# Patient Record
Sex: Female | Born: 1975 | Race: White | Hispanic: No | Marital: Single | Smoking: Current every day smoker
Health system: Southern US, Community
[De-identification: ages and names within clinical notes are randomized; demographics above are authoritative.]

## PROBLEM LIST (undated history)

## (undated) DIAGNOSIS — I10 Essential (primary) hypertension: Secondary | ICD-10-CM

## (undated) DIAGNOSIS — J45909 Unspecified asthma, uncomplicated: Secondary | ICD-10-CM

## (undated) DIAGNOSIS — F419 Anxiety disorder, unspecified: Secondary | ICD-10-CM

---

## 2004-03-31 ENCOUNTER — Ambulatory Visit: Payer: Self-pay

## 2004-04-22 ENCOUNTER — Other Ambulatory Visit: Payer: Self-pay

## 2004-04-22 ENCOUNTER — Emergency Department: Payer: Self-pay | Admitting: Emergency Medicine

## 2004-08-05 ENCOUNTER — Observation Stay: Payer: Self-pay | Admitting: Obstetrics and Gynecology

## 2004-08-13 ENCOUNTER — Observation Stay: Payer: Self-pay | Admitting: Certified Nurse Midwife

## 2004-09-04 ENCOUNTER — Observation Stay: Payer: Self-pay | Admitting: Obstetrics and Gynecology

## 2004-09-07 ENCOUNTER — Observation Stay: Payer: Self-pay

## 2004-09-10 ENCOUNTER — Observation Stay: Payer: Self-pay | Admitting: Obstetrics and Gynecology

## 2004-09-13 ENCOUNTER — Observation Stay: Payer: Self-pay | Admitting: Certified Nurse Midwife

## 2004-09-18 ENCOUNTER — Observation Stay: Payer: Self-pay

## 2004-09-19 ENCOUNTER — Observation Stay: Payer: Self-pay | Admitting: Obstetrics and Gynecology

## 2004-09-22 ENCOUNTER — Observation Stay: Payer: Self-pay | Admitting: Obstetrics and Gynecology

## 2004-09-26 ENCOUNTER — Observation Stay: Payer: Self-pay

## 2004-09-29 ENCOUNTER — Observation Stay: Payer: Self-pay

## 2004-10-03 ENCOUNTER — Observation Stay: Payer: Self-pay | Admitting: Obstetrics and Gynecology

## 2004-10-04 ENCOUNTER — Inpatient Hospital Stay: Payer: Self-pay | Admitting: Obstetrics and Gynecology

## 2005-03-27 ENCOUNTER — Ambulatory Visit: Payer: Self-pay | Admitting: Psychiatry

## 2005-03-31 ENCOUNTER — Ambulatory Visit: Payer: Self-pay | Admitting: Psychiatry

## 2005-12-24 ENCOUNTER — Ambulatory Visit: Payer: Self-pay

## 2006-06-17 IMAGING — US US OB US >=[ID] SNGL FETUS
1 series · 14 of 18 positions shown · non-contrast
Comparison: none

REASON FOR EXAM: dates
COMMENTS:

[Series 1: us ob us >=(id) sngl fetus · 0.27mm/px · 14 of 18 slices shown]
[im 1/18]
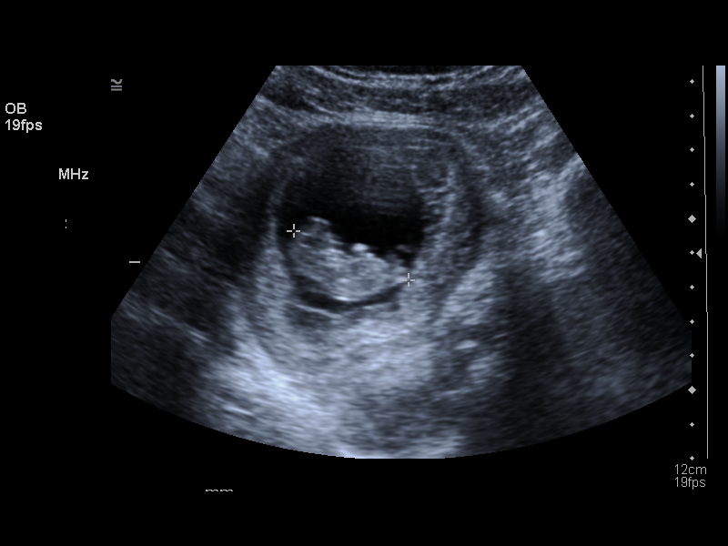
[im 2/18]
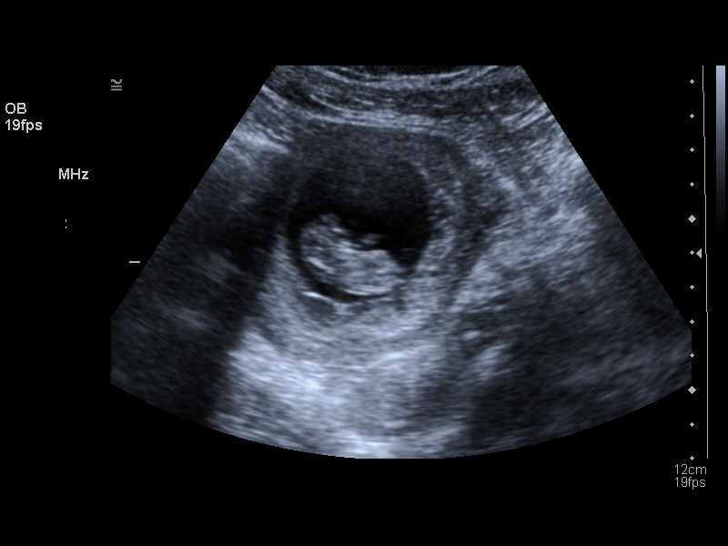
[im 4/18]
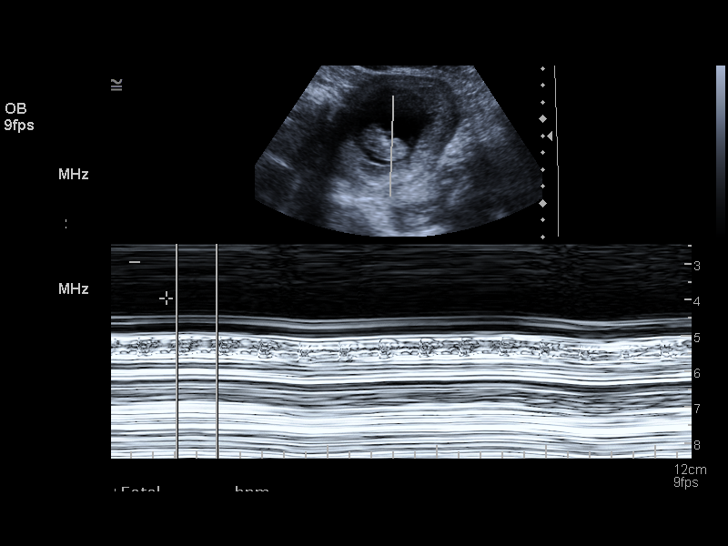
[im 5/18]
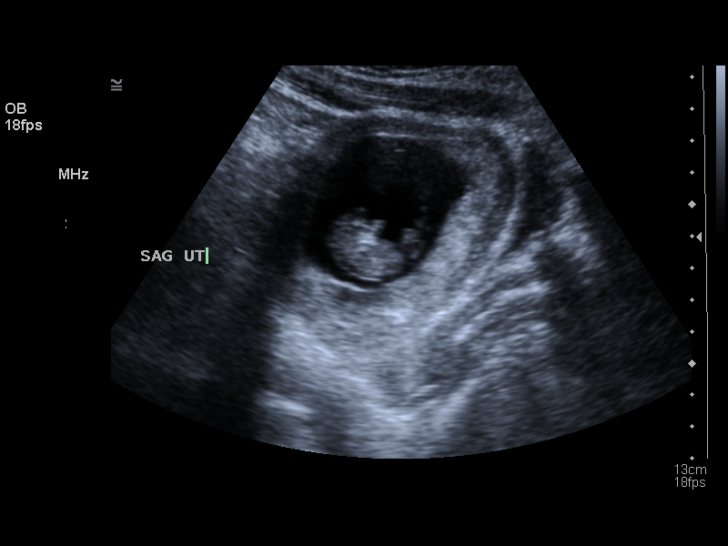
[im 6/18]
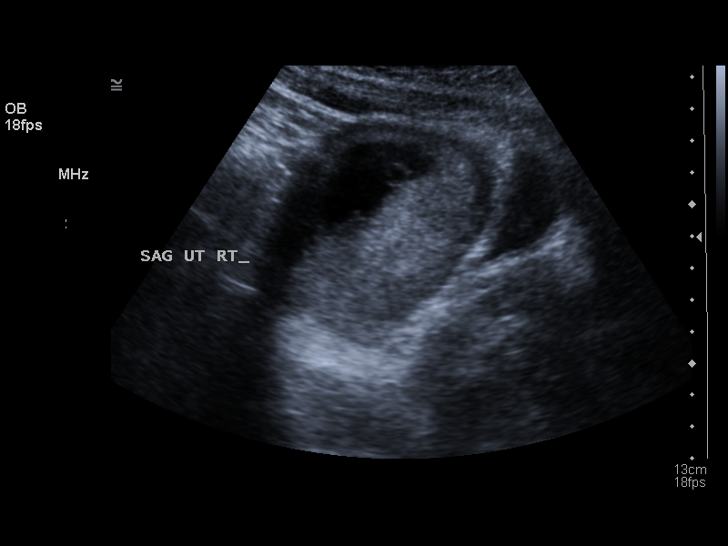
[im 8/18]
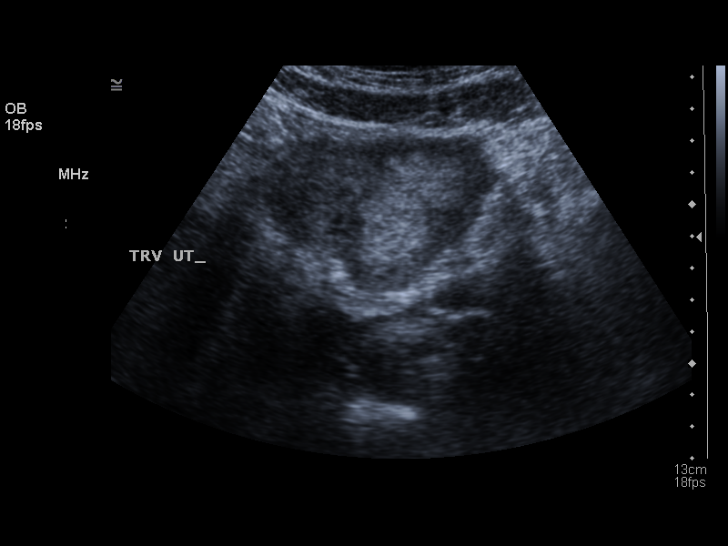
[im 9/18]
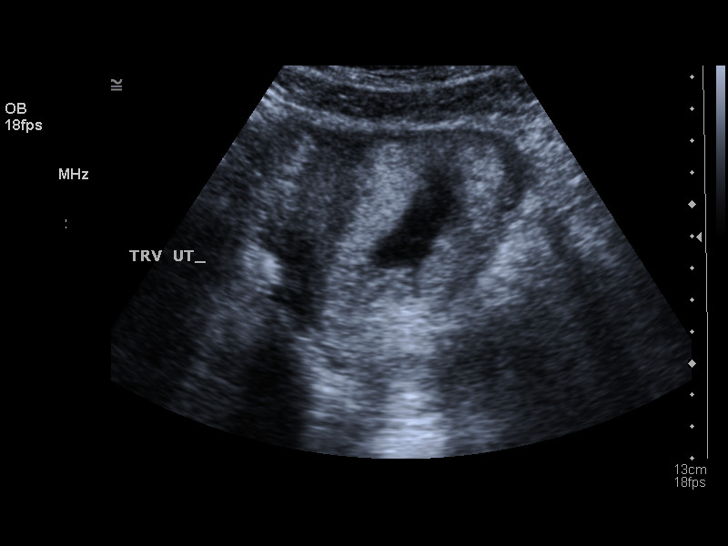
[im 10/18]
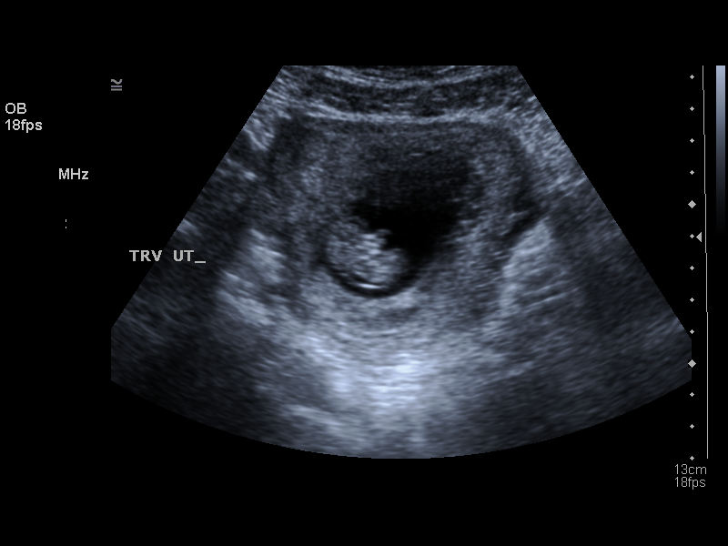
[im 11/18]
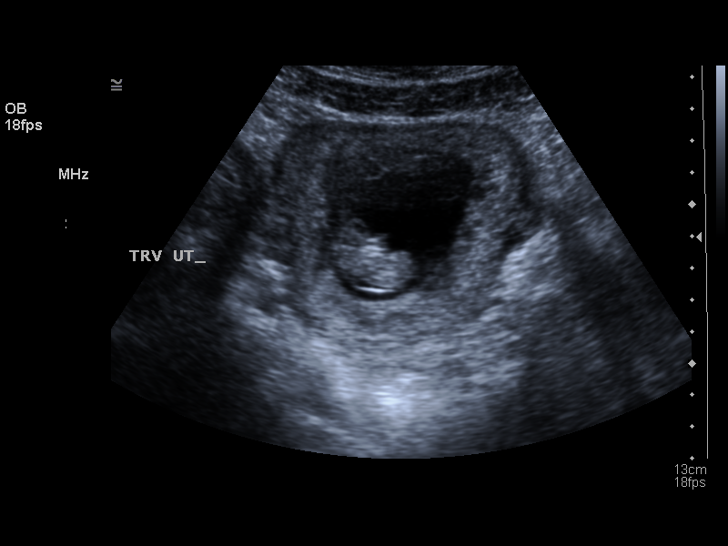
[im 13/18]
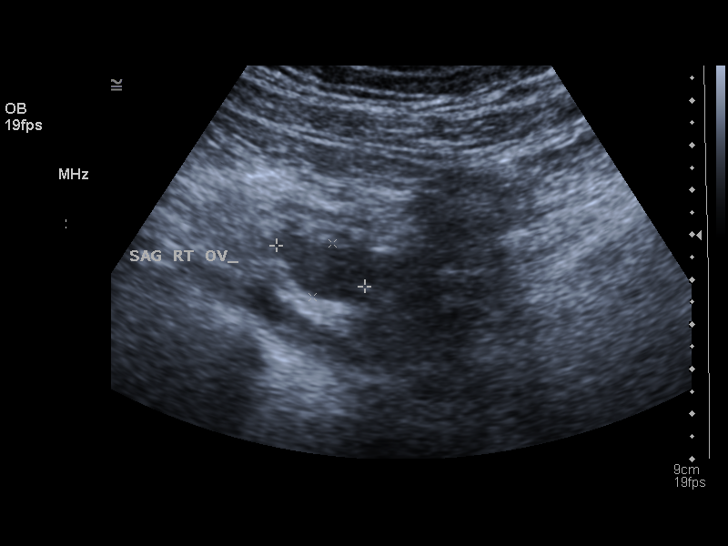
[im 14/18]
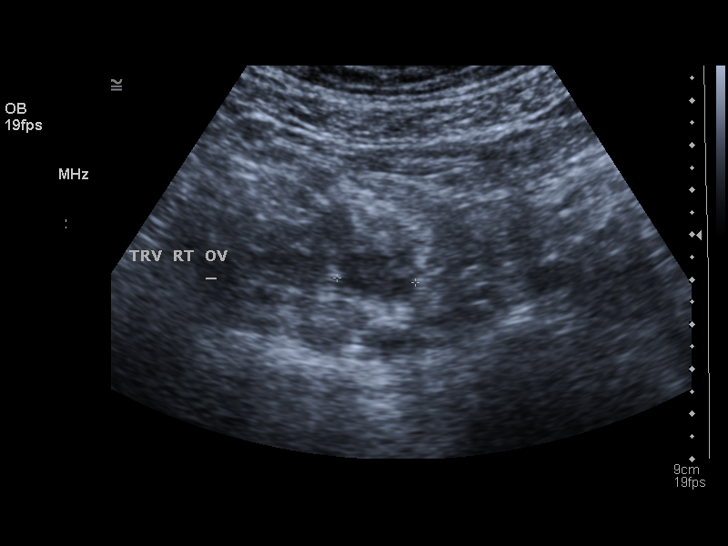
[im 15/18]
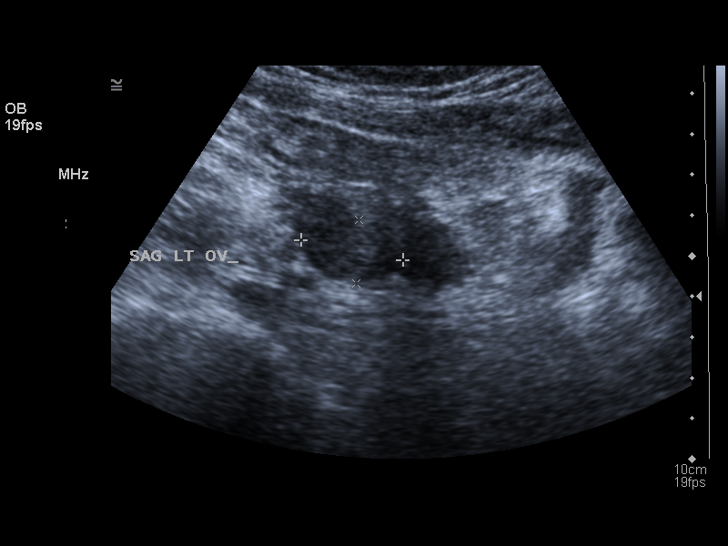
[im 17/18]
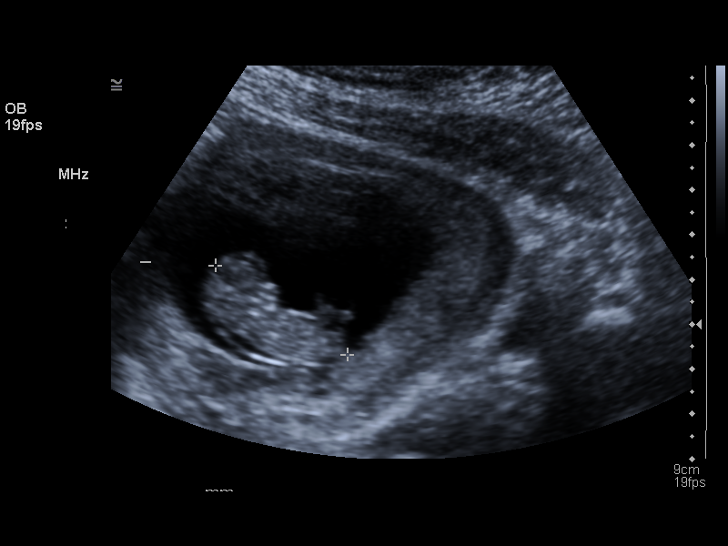
[im 18/18]
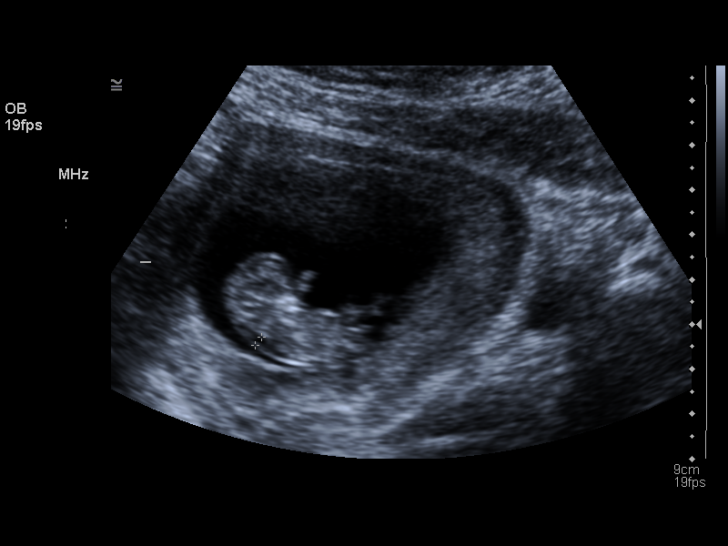

[14 of 18 positions shown; findings below may reference images not displayed]

PROCEDURE:     US  - US PREGNANCY / FETAL AGE  - March 31, 2004 [DATE]

RESULT:     Ultrasound evaluation of the gravid uterus shows a single,
viable intrauterine pregnancy with a heart rate of 163 beats per minute.
Crown-rump length measures 36.4 mm consistent with ultrasound dating of 10
weeks, 4 days and an estimated date of delivery of 10/23/2004.
IMPRESSION: Single, viable intrauterine pregnancy with ultrasound dating
of 10 weeks, 4 days and estimated date of delivery of 10/23/2004.

## 2008-05-01 ENCOUNTER — Inpatient Hospital Stay: Payer: Self-pay | Admitting: *Deleted

## 2009-01-14 ENCOUNTER — Ambulatory Visit: Payer: Self-pay | Admitting: Family Medicine

## 2009-02-02 ENCOUNTER — Ambulatory Visit: Payer: Self-pay | Admitting: Unknown Physician Specialty

## 2009-03-01 ENCOUNTER — Ambulatory Visit: Payer: Self-pay | Admitting: Family Medicine

## 2009-12-23 ENCOUNTER — Ambulatory Visit: Payer: Self-pay | Admitting: Family Medicine

## 2012-02-21 DIAGNOSIS — O9989 Other specified diseases and conditions complicating pregnancy, childbirth and the puerperium: Secondary | ICD-10-CM

## 2012-02-21 DIAGNOSIS — IMO0002 Reserved for concepts with insufficient information to code with codable children: Secondary | ICD-10-CM

## 2012-02-21 HISTORY — DX: Reserved for concepts with insufficient information to code with codable children: IMO0002

## 2012-02-21 HISTORY — DX: Other specified diseases and conditions complicating pregnancy, childbirth and the puerperium: O99.89

## 2013-03-12 ENCOUNTER — Emergency Department: Payer: Self-pay | Admitting: Emergency Medicine

## 2013-03-12 LAB — COMPREHENSIVE METABOLIC PANEL
AST: 16 U/L (ref 15–37)
Albumin: 3.8 g/dL (ref 3.4–5.0)
Alkaline Phosphatase: 69 U/L
Anion Gap: 3 — ABNORMAL LOW (ref 7–16)
BILIRUBIN TOTAL: 0.5 mg/dL (ref 0.2–1.0)
BUN: 8 mg/dL (ref 7–18)
CO2: 29 mmol/L (ref 21–32)
Calcium, Total: 8.8 mg/dL (ref 8.5–10.1)
Chloride: 104 mmol/L (ref 98–107)
Creatinine: 0.76 mg/dL (ref 0.60–1.30)
EGFR (Non-African Amer.): >60
GLUCOSE: 99 mg/dL (ref 65–99)
Osmolality: 270 (ref 275–301)
Potassium: 3.7 mmol/L (ref 3.5–5.1)
SGPT (ALT): 20 U/L (ref 12–78)
SODIUM: 136 mmol/L (ref 136–145)
TOTAL PROTEIN: 7.5 g/dL (ref 6.4–8.2)

## 2013-03-12 LAB — CBC WITH DIFFERENTIAL/PLATELET
BASOS ABS: 0.1 10*3/uL (ref 0.0–0.1)
Basophil %: 0.7 %
EOS PCT: 2.6 %
Eosinophil #: 0.3 10*3/uL (ref 0.0–0.7)
HCT: 42.7 % (ref 35.0–47.0)
HGB: 14.6 g/dL (ref 12.0–16.0)
LYMPHS PCT: 20.8 %
Lymphocyte #: 2.2 10*3/uL (ref 1.0–3.6)
MCH: 30.3 pg (ref 26.0–34.0)
MCHC: 34.2 g/dL (ref 32.0–36.0)
MCV: 89 fL (ref 80–100)
Monocyte #: 0.6 x10 3/mm (ref 0.2–0.9)
Monocyte %: 5.4 %
NEUTROS ABS: 7.4 10*3/uL — AB (ref 1.4–6.5)
Neutrophil %: 70.5 %
PLATELETS: 311 10*3/uL (ref 150–440)
RBC: 4.83 10*6/uL (ref 3.80–5.20)
RDW: 13.4 % (ref 11.5–14.5)
WBC: 10.6 10*3/uL (ref 3.6–11.0)

## 2013-03-12 LAB — URINALYSIS, COMPLETE
Bilirubin,UR: NEGATIVE
Blood: NEGATIVE
Glucose,UR: NEGATIVE mg/dL (ref 0–75)
Hyaline Cast: 2
KETONE: NEGATIVE
LEUKOCYTE ESTERASE: NEGATIVE
Nitrite: NEGATIVE
Ph: 5 (ref 4.5–8.0)
Protein: NEGATIVE
RBC,UR: NONE SEEN /HPF (ref 0–5)
Specific Gravity: 1.03 (ref 1.003–1.030)
Squamous Epithelial: 3
WBC UR: 1 /HPF (ref 0–5)

## 2013-03-12 LAB — GC/CHLAMYDIA PROBE AMP

## 2013-03-12 LAB — WET PREP, GENITAL

## 2013-03-12 LAB — LIPASE, BLOOD: LIPASE: 99 U/L (ref 73–393)

## 2014-09-26 ENCOUNTER — Emergency Department (HOSPITAL_COMMUNITY): Payer: Medicaid Other

## 2014-09-26 ENCOUNTER — Encounter (HOSPITAL_COMMUNITY): Payer: Self-pay | Admitting: Emergency Medicine

## 2014-09-26 ENCOUNTER — Emergency Department (HOSPITAL_COMMUNITY)
Admission: EM | Admit: 2014-09-26 | Discharge: 2014-09-27 | Disposition: A | Discharge status: Home / Self Care | Payer: Medicaid Other | Attending: Emergency Medicine | Admitting: Emergency Medicine

## 2014-09-26 DIAGNOSIS — S01311A Laceration without foreign body of right ear, initial encounter: Secondary | ICD-10-CM | POA: Insufficient documentation

## 2014-09-26 DIAGNOSIS — S0101XA Laceration without foreign body of scalp, initial encounter: Secondary | ICD-10-CM | POA: Diagnosis not present

## 2014-09-26 DIAGNOSIS — S60212A Contusion of left wrist, initial encounter: Secondary | ICD-10-CM | POA: Insufficient documentation

## 2014-09-26 DIAGNOSIS — Y9389 Activity, other specified: Secondary | ICD-10-CM | POA: Diagnosis not present

## 2014-09-26 DIAGNOSIS — I10 Essential (primary) hypertension: Secondary | ICD-10-CM | POA: Diagnosis not present

## 2014-09-26 DIAGNOSIS — Z720 Tobacco use: Secondary | ICD-10-CM | POA: Insufficient documentation

## 2014-09-26 DIAGNOSIS — Z880 Allergy status to penicillin: Secondary | ICD-10-CM | POA: Insufficient documentation

## 2014-09-26 DIAGNOSIS — Z23 Encounter for immunization: Secondary | ICD-10-CM | POA: Diagnosis not present

## 2014-09-26 DIAGNOSIS — Z8659 Personal history of other mental and behavioral disorders: Secondary | ICD-10-CM | POA: Insufficient documentation

## 2014-09-26 DIAGNOSIS — J45909 Unspecified asthma, uncomplicated: Secondary | ICD-10-CM | POA: Diagnosis not present

## 2014-09-26 DIAGNOSIS — Y92410 Unspecified street and highway as the place of occurrence of the external cause: Secondary | ICD-10-CM | POA: Diagnosis not present

## 2014-09-26 DIAGNOSIS — S40011A Contusion of right shoulder, initial encounter: Secondary | ICD-10-CM

## 2014-09-26 DIAGNOSIS — Y998 Other external cause status: Secondary | ICD-10-CM | POA: Diagnosis not present

## 2014-09-26 DIAGNOSIS — S0990XA Unspecified injury of head, initial encounter: Secondary | ICD-10-CM | POA: Diagnosis present

## 2014-09-26 HISTORY — DX: Unspecified asthma, uncomplicated: J45.909

## 2014-09-26 HISTORY — DX: Essential (primary) hypertension: I10

## 2014-09-26 HISTORY — DX: Anxiety disorder, unspecified: F41.9

## 2014-09-26 MED ORDER — OXYCODONE-ACETAMINOPHEN 5-325 MG PO TABS
1.0000 | ORAL_TABLET | Freq: Once | ORAL | Status: AC
  Administered 2014-09-26: 1 via ORAL
  Filled 2014-09-26: qty 1

## 2014-09-26 MED ORDER — TETANUS-DIPHTH-ACELL PERTUSSIS 5-2.5-18.5 LF-MCG/0.5 IM SUSP
0.5000 mL | Freq: Once | INTRAMUSCULAR | Status: AC
  Administered 2014-09-26: 0.5 mL via INTRAMUSCULAR
  Filled 2014-09-26: qty 0.5

## 2014-09-26 MED ORDER — LIDOCAINE-EPINEPHRINE (PF) 2 %-1:200000 IJ SOLN
10.0000 mL | Freq: Once | INTRAMUSCULAR | Status: DC
  Administered 2014-09-27: 10 mL
  Filled 2014-09-26: qty 20

## 2014-09-26 MED ORDER — LIDOCAINE HCL (PF) 1 % IJ SOLN
2.0000 mL | Freq: Once | INTRAMUSCULAR | Status: AC
  Administered 2014-09-27: 2 mL
  Filled 2014-09-26: qty 5

## 2014-09-26 MED ORDER — KETOROLAC TROMETHAMINE 60 MG/2ML IM SOLN
60.0000 mg | Freq: Once | INTRAMUSCULAR | Status: AC
  Administered 2014-09-26: 60 mg via INTRAMUSCULAR
  Filled 2014-09-26: qty 2
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
## 2014-09-26 NOTE — ED Notes (Signed)
ED Doctor at bedside.  
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
## 2014-09-26 NOTE — ED Notes (Signed)
Doctor ordered patient able to eat and drink. Gave [place] sandwich and apple sauce to patient and family member at bedside.
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
## 2014-09-27 MED ORDER — OXYCODONE-ACETAMINOPHEN 5-325 MG PO TABS: 1.0000 | ORAL_TABLET | Freq: Four times a day (QID) | ORAL | Status: DC | PRN
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
## 2018-07-15 DIAGNOSIS — G8929 Other chronic pain: Secondary | ICD-10-CM

## 2018-07-15 DIAGNOSIS — M5442 Lumbago with sciatica, left side: Secondary | ICD-10-CM | POA: Insufficient documentation

## 2018-07-15 DIAGNOSIS — M542 Cervicalgia: Secondary | ICD-10-CM

## 2018-07-15 DIAGNOSIS — M5441 Lumbago with sciatica, right side: Secondary | ICD-10-CM

## 2018-07-15 HISTORY — DX: Cervicalgia: M54.2

## 2018-07-15 HISTORY — DX: Other chronic pain: G89.29

## 2018-07-15 HISTORY — DX: Lumbago with sciatica, right side: M54.41

## 2019-04-20 ENCOUNTER — Emergency Department (HOSPITAL_COMMUNITY): Payer: Medicaid Other

## 2019-04-20 ENCOUNTER — Other Ambulatory Visit: Payer: Self-pay

## 2019-04-20 ENCOUNTER — Emergency Department (HOSPITAL_COMMUNITY)
Admission: EM | Admit: 2019-04-20 | Discharge: 2019-04-20 | Disposition: A | Discharge status: Home / Self Care | Payer: Medicaid Other | Attending: Emergency Medicine | Admitting: Emergency Medicine

## 2019-04-20 ENCOUNTER — Encounter (HOSPITAL_COMMUNITY): Payer: Self-pay | Admitting: Emergency Medicine

## 2019-04-20 DIAGNOSIS — R0602 Shortness of breath: Secondary | ICD-10-CM | POA: Diagnosis not present

## 2019-04-20 DIAGNOSIS — Z5321 Procedure and treatment not carried out due to patient leaving prior to being seen by health care provider: Secondary | ICD-10-CM | POA: Diagnosis not present

## 2019-04-20 DIAGNOSIS — R0789 Other chest pain: Secondary | ICD-10-CM | POA: Insufficient documentation

## 2019-04-20 DIAGNOSIS — R109 Unspecified abdominal pain: Secondary | ICD-10-CM | POA: Diagnosis not present

## 2019-04-20 LAB — CBC
HCT: 41.3 % (ref 36.0–46.0)
Hemoglobin: 13.6 g/dL (ref 12.0–15.0)
MCH: 32.2 pg (ref 26.0–34.0)
MCHC: 32.9 g/dL (ref 30.0–36.0)
MCV: 97.9 fL (ref 80.0–100.0)
Platelets: 186 10*3/uL (ref 150–400)
RBC: 4.22 MIL/uL (ref 3.87–5.11)
RDW: 13.2 % (ref 11.5–15.5)
WBC: 5.4 10*3/uL (ref 4.0–10.5)
nRBC: 0 % (ref 0.0–0.2)

## 2019-04-20 LAB — COMPREHENSIVE METABOLIC PANEL
ALT: 13 U/L (ref 0–44)
AST: 26 U/L (ref 15–41)
Albumin: 3.3 g/dL — ABNORMAL LOW (ref 3.5–5.0)
Alkaline Phosphatase: 44 U/L (ref 38–126)
Anion gap: 16 — ABNORMAL HIGH (ref 5–15)
BUN: 8 mg/dL (ref 6–20)
CO2: 19 mmol/L — ABNORMAL LOW (ref 22–32)
Calcium: 9.2 mg/dL (ref 8.9–10.3)
Chloride: 105 mmol/L (ref 98–111)
Creatinine, Ser: 0.7 mg/dL (ref 0.44–1.00)
GFR calc Af Amer: >60 mL/min (ref >60)
GFR calc non Af Amer: >60 mL/min (ref >60)
Glucose, Bld: 120 mg/dL — ABNORMAL HIGH (ref 70–99)
Potassium: 3.2 mmol/L — ABNORMAL LOW (ref 3.5–5.1)
Sodium: 140 mmol/L (ref 135–145)
Total Bilirubin: 0.8 mg/dL (ref 0.3–1.2)
Total Protein: 7 g/dL (ref 6.5–8.1)

## 2019-04-20 LAB — LIPASE, BLOOD: Lipase: 34 U/L (ref 11–51)

## 2019-04-20 LAB — I-STAT BETA HCG BLOOD, ED (MC, WL, AP ONLY): I-stat hCG, quantitative: <5 m[IU]/mL (ref <5)

## 2019-04-20 LAB — TROPONIN I (HIGH SENSITIVITY): Troponin I (High Sensitivity): 3 ng/L (ref <18)

## 2019-04-20 NOTE — ED Notes (Addendum)
Pt states unable to wait longer and calls husband to pick her up. IV removed by this tech.
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
## 2020-04-11 DIAGNOSIS — E669 Obesity, unspecified: Secondary | ICD-10-CM | POA: Diagnosis not present

## 2020-04-11 DIAGNOSIS — R079 Chest pain, unspecified: Secondary | ICD-10-CM | POA: Diagnosis not present

## 2020-04-11 DIAGNOSIS — F17200 Nicotine dependence, unspecified, uncomplicated: Secondary | ICD-10-CM | POA: Diagnosis not present

## 2020-04-11 DIAGNOSIS — I160 Hypertensive urgency: Secondary | ICD-10-CM | POA: Diagnosis not present

## 2020-04-12 DIAGNOSIS — R079 Chest pain, unspecified: Secondary | ICD-10-CM | POA: Diagnosis not present

## 2020-04-13 ENCOUNTER — Other Ambulatory Visit: Payer: Self-pay

## 2020-04-13 DIAGNOSIS — F419 Anxiety disorder, unspecified: Secondary | ICD-10-CM | POA: Insufficient documentation

## 2020-04-13 DIAGNOSIS — I10 Essential (primary) hypertension: Secondary | ICD-10-CM | POA: Insufficient documentation

## 2020-04-13 DIAGNOSIS — J45909 Unspecified asthma, uncomplicated: Secondary | ICD-10-CM | POA: Insufficient documentation

## 2020-04-15 ENCOUNTER — Telehealth: Payer: Self-pay | Admitting: Cardiology

## 2020-04-15 ENCOUNTER — Ambulatory Visit: Payer: Medicaid Other | Admitting: Cardiology

## 2020-04-15 NOTE — Telephone Encounter (Signed)
Spoke to patient just now and let her know that we can not call in her hydrocodone for her. She verbalizes understanding and I helped her to reschedule her appointment.    Encouraged patient to call back with any questions or concerns.

## 2020-04-15 NOTE — Telephone Encounter (Signed)
    Pt said she can't make it to her appt today, her horse is about to give birth and she can't leave her, she needs to be there in standby so she can call the vet. She said she feeling great, she's been drinking more and down to 3 cigarettes a day. She wanted to asked Dr. Servando Salina when she can r/s, she can't come ine on 02/15 she have an appt with pcp. She also wanted to ask Dr. Servando Salina if she can refill hydrocodone for her

## 2020-04-29 ENCOUNTER — Other Ambulatory Visit: Payer: Self-pay

## 2020-04-29 DIAGNOSIS — N898 Other specified noninflammatory disorders of vagina: Secondary | ICD-10-CM

## 2020-04-29 DIAGNOSIS — N9089 Other specified noninflammatory disorders of vulva and perineum: Secondary | ICD-10-CM | POA: Insufficient documentation

## 2020-04-29 DIAGNOSIS — N920 Excessive and frequent menstruation with regular cycle: Secondary | ICD-10-CM | POA: Insufficient documentation

## 2020-04-29 HISTORY — DX: Other specified noninflammatory disorders of vagina: N89.8

## 2020-04-29 HISTORY — DX: Excessive and frequent menstruation with regular cycle: N92.0

## 2020-04-29 HISTORY — DX: Other specified noninflammatory disorders of vulva and perineum: N90.89

## 2020-05-02 ENCOUNTER — Other Ambulatory Visit: Payer: Self-pay

## 2020-05-02 ENCOUNTER — Encounter: Payer: Self-pay | Admitting: Cardiology

## 2020-05-02 ENCOUNTER — Ambulatory Visit (INDEPENDENT_AMBULATORY_CARE_PROVIDER_SITE_OTHER): Payer: Managed Care, Other (non HMO) | Admitting: Cardiology

## 2020-05-02 VITALS — BP 138/78 | HR 86 | Ht 61.0 in | Wt 155.2 lb

## 2020-05-02 DIAGNOSIS — R079 Chest pain, unspecified: Secondary | ICD-10-CM

## 2020-05-02 DIAGNOSIS — R9439 Abnormal result of other cardiovascular function study: Secondary | ICD-10-CM | POA: Diagnosis not present

## 2020-05-02 DIAGNOSIS — F17200 Nicotine dependence, unspecified, uncomplicated: Secondary | ICD-10-CM

## 2020-05-02 DIAGNOSIS — I10 Essential (primary) hypertension: Secondary | ICD-10-CM

## 2020-05-02 NOTE — Patient Instructions (Signed)
Medication Instructions:  Your physician recommends that you continue on your current medications as directed. Please refer to the Current Medication list given to you today.  *If you need a refill on your cardiac medications before your next appointment, please call your pharmacy*   Lab Work: Your physician recommends that you return for lab work:  3-7 days before CT: BMET, Mag If you have labs (blood work) drawn today and your tests are completely normal, you will receive your results only by: Marland Kitchen MyChart Message (if you have MyChart) OR . A paper copy in the mail If you have any lab test that is abnormal or we need to change your treatment, we will call you to review the results.   Testing/Procedures: Your cardiac CT will be scheduled at:  [facility] [address] [place], [place] [address] [phone]   If scheduled at [facility], please arrive at the [facility] main entrance (entrance A) of [facility] 30 minutes prior to test start time. Proceed to the [facility] Radiology Department (first floor) to check-in and test prep.   Please follow these instructions carefully (unless otherwise directed):  On the Night Before the Test: . Be sure to Drink plenty of water. . Do not consume any caffeinated/decaffeinated beverages or chocolate 12 hours prior to your test. . Do not take any antihistamines 12 hours prior to your test.  On the Day of the Test: . Drink plenty of water until 1 hour prior to the test. . Do not eat any food 4 hours prior to the test. . You may take your regular medications prior to the test.  . FEMALES- please wear underwire-free bra if available       After the Test: . Drink plenty of water. . After receiving IV contrast, you may experience a mild flushed feeling. This is normal. . On occasion, you may experience a mild rash up to 24 hours after the test. This is not dangerous. If this occurs, you can take Benadryl 25  mg and increase your fluid intake. . If you experience trouble breathing, this can be serious. If it is severe call 911 IMMEDIATELY. If it is mild, please call our office. . If you take any of these medications: Glipizide/Metformin, Avandament, Glucavance, please do not take 48 hours after completing test unless otherwise instructed.   Once we have confirmed authorization from your insurance company, we will call you to set up a date and time for your test. Based on how quickly your insurance processes prior authorizations requests, please allow up to 4 weeks to be contacted for scheduling your Cardiac CT appointment. Be advised that routine Cardiac CT appointments could be scheduled as many as 8 weeks after your provider has ordered it.  For non-scheduling related questions, please contact the cardiac imaging nurse navigator should you have any questions/concerns: Rockwell Alexandria, Cardiac Imaging Nurse Navigator Larey Brick, Cardiac Imaging Nurse Navigator [facility] Direct Office Dial: [phone]   For scheduling needs, including cancellations and rescheduling, please call [place], [phone].     Follow-Up: At [facility], you and your health needs are our priority.  As part of our continuing mission to provide you with exceptional heart care, we have created designated Provider Care Teams.  These Care Teams include your primary Cardiologist (physician) and Advanced Practice Providers (APPs -  Physician Assistants and Nurse Practitioners) who all work together to provide you with the care you need, when you  need it.  We recommend signing up for the patient portal called "MyChart".  Sign up information is provided on this After Visit Summary.  MyChart is used to connect with patients for Virtual Visits (Telemedicine).  Patients are able to view lab/test results, encounter notes, upcoming appointments, etc.  Non-urgent messages can be sent to your provider as  well.   To learn more about what you can do with MyChart, go to [email or web address].    Your next appointment:   3 month(s)  The format for your next appointment:   In Person  Provider:   Thomasene Ripple, DO   Other Instructions

## 2020-05-02 NOTE — Progress Notes (Signed)
Cardiology Office Note:    Date:  05/02/2020   ID:  Lorraine Bennett, DOB 04/16/1975, MRN [id number]  PCP:  Hurshel Party, NP  Cardiologist:  Thomasene Ripple, DO  Electrophysiologist:  None   Referring MD: No ref. provider found   I was doing well after the left the hospital but not so good now.  History of Present Illness:    Lorraine Bennett is a 45 y.o. female with a hx of hypertension, current smoker, family history of premature coronary artery disease is here today for hospital follow-up.  The patient was admitted in the hospital back in February at that time she was experiencing chest pain.  Given her risk factors she was admitted and underwent a nuclear stress test.  The nuclear stress test was read as equivocal finding in the anteroseptal wall and cannot exclude mild reversibility in this area.  Her EF is 65%.  She has had initially prior to her hospitalization she felt better but recently she is feeling again with the chest pain and discomfort.  She is in the office today with her son.  Past Medical History:  Diagnosis Date  . Anxiety   . Asthma   . Hypertension     Past Surgical History:  Procedure Laterality Date  . CESAREAN SECTION      Current Medications: Current Meds  Medication Sig  . albuterol (VENTOLIN HFA) 108 (90 Base) MCG/ACT inhaler INHALE TWO PUFFS EVERY 4 TO 6 HOURS AS NEEDED FOR WHEEZING OR SHORTNESS OF BREATH  . ALPRAZolam (XANAX) 1 MG tablet Take by mouth.  . cyclobenzaprine (FLEXERIL) 10 MG tablet Take 1 tablet by mouth at bedtime.  . dicyclomine (BENTYL) 10 MG capsule TAKE ONE CAPSULE BY MOUTH EVERY 6 HOURS AS NEEDED FOR ABDOMINAL pain  . fluticasone (FLONASE) 50 MCG/ACT nasal spray Place into the nose.  Marland Kitchen Hyoscyamine Sulfate SL 0.125 MG SUBL TAKE ONE TABLET UNDER THE TONGUE EVERY 4 TO 6 HOURS AS NEEDED FOR ABDOMINAL pain/diarrhea  . montelukast (SINGULAIR) 10 MG tablet Take 1 tablet by mouth daily.  Marland Kitchen omeprazole (PRILOSEC) 40 MG  capsule TAKE ONE CAPSULE BY MOUTH DAILY FOR acid reflux  . oxyCODONE-acetaminophen (PERCOCET/ROXICET) 5-325 MG per tablet Take 1 tablet by mouth every 6 (six) hours as needed for severe pain.  . potassium chloride SA (KLOR-CON) 20 MEQ tablet Take 1 tablet by mouth daily.  . traMADol (ULTRAM) 50 MG tablet Take 2 tablets by mouth 2 (two) times daily.  . [DISCONTINUED] butalbital-acetaminophen-caffeine (FIORICET) 50-325-40 MG tablet TAKE ONE TABLET BY MOUTH EVERY 4 TO 6 HOURS AS NEEDED  . [DISCONTINUED] DULoxetine (CYMBALTA) 60 MG capsule Take 1 capsule by mouth daily.  . [DISCONTINUED] gabapentin (NEURONTIN) 300 MG capsule Take by mouth.  . [DISCONTINUED] metoprolol tartrate (LOPRESSOR) 100 MG tablet Take 1 tablet by mouth daily.  . [DISCONTINUED] naproxen (NAPROSYN) 500 MG tablet Take 1 tablet by mouth 2 (two) times daily with a meal.  . [DISCONTINUED] traZODone (DESYREL) 100 MG tablet Take 1 tablet by mouth at bedtime.     Allergies:   Amoxicillin, Lisinopril-hydrochlorothiazide, and Penicillins   Social History   Socioeconomic History  . Marital status: Single    Spouse name: Not on file  . Number of children: Not on file  . Years of education: Not on file  . Highest education level: Not on file  Occupational History  . Not on file  Tobacco Use  . Smoking status: Current Every Day Smoker  . Smokeless tobacco: Not  on file  Substance and Sexual Activity  . Alcohol use: Yes  . Drug use: No  . Sexual activity: Not on file  Other Topics Concern  . Not on file  Social History Narrative  . Not on file   Social Determinants of Health   Financial Resource Strain: Not on file  Food Insecurity: Not on file  Transportation Needs: Not on file  Physical Activity: Not on file  Stress: Not on file  Social Connections: Not on file     Family History: The patient's family history is not on file.  ROS:   Review of Systems  Constitution: Negative for decreased appetite, fever and  weight gain.  HENT: Negative for congestion, ear discharge, hoarse voice and sore throat.   Eyes: Negative for discharge, redness, vision loss in right eye and visual halos.  Cardiovascular: Negative for chest pain, dyspnea on exertion, leg swelling, orthopnea and palpitations.  Respiratory: Negative for cough, hemoptysis, shortness of breath and snoring.   Endocrine: Negative for heat intolerance and polyphagia.  Hematologic/Lymphatic: Negative for bleeding problem. Does not bruise/bleed easily.  Skin: Negative for flushing, nail changes, rash and suspicious lesions.  Musculoskeletal: Negative for arthritis, joint pain, muscle cramps, myalgias, neck pain and stiffness.  Gastrointestinal: Negative for abdominal pain, bowel incontinence, diarrhea and excessive appetite.  Genitourinary: Negative for decreased libido, genital sores and incomplete emptying.  Neurological: Negative for brief paralysis, focal weakness, headaches and loss of balance.  Psychiatric/Behavioral: Negative for altered mental status, depression and suicidal ideas.  Allergic/Immunologic: Negative for HIV exposure and persistent infections.    EKGs/Labs/Other Studies Reviewed:    The following studies were reviewed today:   EKG: None today  Lexiscan done at [facility] on April 10, 2020 equivocal finding in the anteroseptal wall.  Difficult exclude mild reversibility in this area.  EF normal, wall motion is normal as well.  Recent Labs: No results found for requested labs within last 8760 hours.  Recent Lipid Panel No results found for: CHOL, TRIG, HDL, CHOLHDL, VLDL, LDLCALC, LDLDIRECT  Physical Exam:    VS:  BP 138/78   Pulse 86   Ht 5\' 1"  (1.549 m)   Wt 155 lb 3.2 oz (70.4 kg)   SpO2 98%   BMI 29.32 kg/m     Wt Readings from Last 3 Encounters:  05/02/20 155 lb 3.2 oz (70.4 kg)  09/26/14 129 lb (58.5 kg)     GEN: Well nourished, well developed in no acute distress HEENT: Normal NECK: No JVD; No  carotid bruits LYMPHATICS: No lymphadenopathy CARDIAC: S1S2 noted,RRR, no murmurs, rubs, gallops RESPIRATORY:  Clear to auscultation without rales, wheezing or rhonchi  ABDOMEN: Soft, non-tender, non-distended, +bowel sounds, no guarding. EXTREMITIES: No edema, No cyanosis, no clubbing MUSCULOSKELETAL:  No deformity  SKIN: Warm and dry NEUROLOGIC:  Alert and oriented x 3, non-focal PSYCHIATRIC:  Normal affect, good insight  ASSESSMENT:    1. Abnormal stress test   2. Chest pain, unspecified type   3. Hypertension, unspecified type   4. Smoker    PLAN:     Since she still is having chest pressure with her equivocal stress test I like to proceed with ischemic evaluation again doing a definitive testing with a coronary CTA.  She has no IV contrast dye allergy.  She is agreeable to proceed.  Educated patient about the testing of her questions has been answered.  Her blood pressure in the office today which was manually obtained by me 130/78 mmHg.  She  tells me at home her blood pressure is on the lower side she was started on Coreg 12.5 mg twice daily in the hospital we will continue this for now.  Smoking cessation advised.  The patient is in agreement with the above plan. The patient left the office in stable condition.  The patient will follow up in 3 months or sooner if needed.   Medication Adjustments/Labs and Tests Ordered: Current medicines are reviewed at length with the patient today.  Concerns regarding medicines are outlined above.  Orders Placed This Encounter  Procedures  . CT CORONARY MORPH W/CTA COR W/SCORE W/CA W/CM &/OR WO/CM  . CT CORONARY FRACTIONAL FLOW RESERVE DATA PREP  . CT CORONARY FRACTIONAL FLOW RESERVE FLUID ANALYSIS  . Basic metabolic panel  . Magnesium   No orders of the defined types were placed in this encounter.   Patient Instructions  Medication Instructions:  Your physician recommends that you continue on your current medications as directed.  Please refer to the Current Medication list given to you today.  *If you need a refill on your cardiac medications before your next appointment, please call your pharmacy*   Lab Work: Your physician recommends that you return for lab work:  3-7 days before CT: BMET, Mag If you have labs (blood work) drawn today and your tests are completely normal, you will receive your results only by: Marland Kitchen MyChart Message (if you have MyChart) OR . A paper copy in the mail If you have any lab test that is abnormal or we need to change your treatment, we will call you to review the results.   Testing/Procedures: Your cardiac CT will be scheduled at:  [facility] [address] [place], [place] [address] [phone]   If scheduled at [facility], please arrive at the [facility] main entrance (entrance A) of [facility] 30 minutes prior to test start time. Proceed to the [facility] Radiology Department (first floor) to check-in and test prep.   Please follow these instructions carefully (unless otherwise directed):  On the Night Before the Test: . Be sure to Drink plenty of water. . Do not consume any caffeinated/decaffeinated beverages or chocolate 12 hours prior to your test. . Do not take any antihistamines 12 hours prior to your test.  On the Day of the Test: . Drink plenty of water until 1 hour prior to the test. . Do not eat any food 4 hours prior to the test. . You may take your regular medications prior to the test.  . FEMALES- please wear underwire-free bra if available       After the Test: . Drink plenty of water. . After receiving IV contrast, you may experience a mild flushed feeling. This is normal. . On occasion, you may experience a mild rash up to 24 hours after the test. This is not dangerous. If this occurs, you can take Benadryl 25 mg and increase your fluid intake. . If you experience trouble breathing, this can be serious. If it is severe  call 911 IMMEDIATELY. If it is mild, please call our office. . If you take any of these medications: Glipizide/Metformin, Avandament, Glucavance, please do not take 48 hours after completing test unless otherwise instructed.   Once we have confirmed authorization from your insurance company, we will call you to set up a date and time for your test. Based on how quickly your insurance processes prior authorizations requests, please allow up to 4 weeks to  be contacted for scheduling your Cardiac CT appointment. Be advised that routine Cardiac CT appointments could be scheduled as many as 8 weeks after your provider has ordered it.  For non-scheduling related questions, please contact the cardiac imaging nurse navigator should you have any questions/concerns: Rockwell Alexandria, Cardiac Imaging Nurse Navigator Larey Brick, Cardiac Imaging Nurse Navigator [facility] Direct Office Dial: [phone]   For scheduling needs, including cancellations and rescheduling, please call [place], [phone].     Follow-Up: At [facility], you and your health needs are our priority.  As part of our continuing mission to provide you with exceptional heart care, we have created designated Provider Care Teams.  These Care Teams include your primary Cardiologist (physician) and Advanced Practice Providers (APPs -  Physician Assistants and Nurse Practitioners) who all work together to provide you with the care you need, when you need it.  We recommend signing up for the patient portal called "MyChart".  Sign up information is provided on this After Visit Summary.  MyChart is used to connect with patients for Virtual Visits (Telemedicine).  Patients are able to view lab/test results, encounter notes, upcoming appointments, etc.  Non-urgent messages can be sent to your provider as well.   To learn more about what you can do with MyChart, go to [email or web address].    Your next  appointment:   3 month(s)  The format for your next appointment:   In Person  Provider:   Thomasene Ripple, DO   Other Instructions      Adopting a Healthy Lifestyle.  Know what a healthy weight is for you (roughly BMI <25) and aim to maintain this   Aim for 7+ servings of fruits and vegetables daily   65-80+ fluid ounces of water or unsweet tea for healthy kidneys   Limit to max 1 drink of alcohol per day; avoid smoking/tobacco   Limit animal fats in diet for cholesterol and heart health - choose grass fed whenever available   Avoid highly processed foods, and foods high in saturated/trans fats   Aim for low stress - take time to unwind and care for your mental health   Aim for 150 min of moderate intensity exercise weekly for heart health, and weights twice weekly for bone health   Aim for 7-9 hours of sleep daily   When it comes to diets, agreement about the perfect plan isnt easy to find, even among the experts. Experts at the [facility] [employer] developed an idea known as the Healthy Eating Plate. Just imagine a plate divided into logical, healthy portions.   The emphasis is on diet quality:   Load up on vegetables and fruits - one-half of your plate: Aim for color and variety, and remember that potatoes dont count.   Go for whole grains - one-quarter of your plate: Whole wheat, barley, wheat berries, quinoa, oats, brown rice, and foods made with them. If you want pasta, go with whole wheat pasta.   Protein power - one-quarter of your plate: Fish, chicken, beans, and nuts are all healthy, versatile protein sources. Limit red meat.   The diet, however, does go beyond the plate, offering a few other suggestions.   Use healthy plant oils, such as olive, canola, soy, corn, sunflower and peanut. Check the labels, and avoid partially hydrogenated oil, which have unhealthy trans fats.   If youre thirsty, drink water. Coffee and tea are good in moderation, but  skip sugary drinks and limit  milk and dairy products to one or two daily servings.   The type of carbohydrate in the diet is more important than the amount. Some sources of carbohydrates, such as vegetables, fruits, whole grains, and beans-are healthier than others.   Finally, stay active  Signed, Thomasene RippleKardie Rudie Sermons, DO  05/02/2020 5:00 PM    [facility]

## 2020-05-13 ENCOUNTER — Telehealth: Payer: Self-pay | Admitting: Cardiology

## 2020-05-13 MED ORDER — CARVEDILOL 12.5 MG PO TABS: 12.5000 mg | ORAL_TABLET | Freq: Two times a day (BID) | ORAL | 0 refills | Status: DC

## 2020-05-13 NOTE — Telephone Encounter (Signed)
Spoke with pt and advised Rx has been ordered and sent to pt's pharmacy.  Pt verbalizes understanding and thanked Charity fundraiser for the call.

## 2020-05-13 NOTE — Telephone Encounter (Signed)
Pt c/o medication issue:  1. Name of Medication: carvedilol 12.5 mg  2. How are you currently taking this medication (dosage and times per day)? 1 tablet twice a day  3. Are you having a reaction (difficulty breathing--STAT)? no  4. What is your medication issue? Patient states she took her last tablet of the medication today, but was given enough to make it through the weekend from her pharmacy. She state the medication was prescribed while she was at [facility].

## 2020-05-13 NOTE — Telephone Encounter (Signed)
Please prescribe for 1 month.

## 2020-05-24 ENCOUNTER — Other Ambulatory Visit (HOSPITAL_COMMUNITY): Payer: Self-pay | Admitting: Emergency Medicine

## 2020-05-24 ENCOUNTER — Telehealth (HOSPITAL_COMMUNITY): Payer: Self-pay | Admitting: Emergency Medicine

## 2020-05-24 DIAGNOSIS — R079 Chest pain, unspecified: Secondary | ICD-10-CM

## 2020-05-24 MED ORDER — METOPROLOL TARTRATE 100 MG PO TABS: 100.0000 mg | ORAL_TABLET | Freq: Once | ORAL | 0 refills | Status: DC

## 2020-05-24 NOTE — Telephone Encounter (Signed)
Reaching out to patient to offer assistance regarding upcoming cardiac imaging study; pt verbalizes understanding of appt date/time, parking situation and where to check in, pre-test NPO status and medications ordered, and verified current allergies; name and call back number provided for further questions should they arise Rockwell Alexandria RN Navigator Cardiac Imaging [facility] [phone] office [phone] cell  Informed patient of metoprolol prescription and why it was prescribed. Pt verbalized understanding.   Huntley Dec

## 2020-05-24 NOTE — Progress Notes (Signed)
One time dose 100mg  metoprolol tartrate prescribed for HR control for upcoming cardiac CTA. Pt will hold daily coreg.   Will call patient to inform her of same  RN Navigator Cardiac Imaging [facility] [phone] Office  [phone] Cell

## 2020-05-26 ENCOUNTER — Ambulatory Visit (HOSPITAL_COMMUNITY)
Admission: RE | Admit: 2020-05-26 | Discharge: 2020-05-26 | Disposition: A | Discharge status: Home / Self Care | Payer: Managed Care, Other (non HMO) | Source: Ambulatory Visit | Attending: Cardiology | Admitting: Cardiology

## 2020-05-26 ENCOUNTER — Encounter (HOSPITAL_COMMUNITY): Payer: Self-pay

## 2020-05-26 ENCOUNTER — Other Ambulatory Visit: Payer: Self-pay

## 2020-05-26 DIAGNOSIS — R9439 Abnormal result of other cardiovascular function study: Secondary | ICD-10-CM | POA: Diagnosis present

## 2020-05-26 DIAGNOSIS — I10 Essential (primary) hypertension: Secondary | ICD-10-CM | POA: Insufficient documentation

## 2020-05-26 DIAGNOSIS — Z006 Encounter for examination for normal comparison and control in clinical research program: Secondary | ICD-10-CM

## 2020-05-26 DIAGNOSIS — F17200 Nicotine dependence, unspecified, uncomplicated: Secondary | ICD-10-CM | POA: Insufficient documentation

## 2020-05-26 DIAGNOSIS — R9430 Abnormal result of cardiovascular function study, unspecified: Secondary | ICD-10-CM | POA: Diagnosis not present

## 2020-05-26 MED ORDER — NITROGLYCERIN 0.4 MG SL SUBL
SUBLINGUAL_TABLET | SUBLINGUAL | Status: AC
  Filled 2020-05-26: qty 2

## 2020-05-26 MED ORDER — NITROGLYCERIN 0.4 MG SL SUBL
0.8000 mg | SUBLINGUAL_TABLET | Freq: Once | SUBLINGUAL | Status: AC
  Administered 2020-05-26: 0.8 mg via SUBLINGUAL

## 2020-05-26 MED ORDER — IOHEXOL 350 MG/ML SOLN
80.0000 mL | Freq: Once | INTRAVENOUS | Status: AC | PRN
  Administered 2020-05-26: 80 mL via INTRAVENOUS

## 2020-05-26 NOTE — Progress Notes (Signed)
Patient tolerated CT well. Drank water after. Vital signs stable encourage to drink water throughout day.Reasons explained and verbalized understanding. Ambulated steady gait.  

## 2020-05-26 NOTE — Research (Signed)
IDENTIFY Informed Consent                  Subject Name: Lorraine Bennett   Subject met inclusion and exclusion criteria.  The informed consent form, study requirements and expectations were reviewed with the subject and questions and concerns were addressed prior to the signing of the consent form.  The subject verbalized understanding of the trial requirements.  The subject agreed to participate in the IDENTIFY trial and signed the informed consent at 11:32AM on 05/26/20.  The informed consent was obtained prior to performance of any protocol-specific procedures for the subject.  A copy of the signed informed consent was given to the subject and a copy was placed in the subject's medical record.   Meade Maw, Naval architect

## 2020-06-03 ENCOUNTER — Other Ambulatory Visit: Payer: Self-pay

## 2020-06-03 MED ORDER — CARVEDILOL 12.5 MG PO TABS: 12.5000 mg | ORAL_TABLET | Freq: Two times a day (BID) | ORAL | 2 refills | Status: DC

## 2020-08-03 ENCOUNTER — Ambulatory Visit: Payer: Medicaid Other | Admitting: Cardiology

## 2020-08-05 ENCOUNTER — Ambulatory Visit: Payer: Managed Care, Other (non HMO) | Admitting: Cardiology

## 2020-08-10 ENCOUNTER — Other Ambulatory Visit: Payer: Self-pay

## 2020-08-15 ENCOUNTER — Ambulatory Visit: Payer: Managed Care, Other (non HMO) | Admitting: Cardiology

## 2020-09-28 ENCOUNTER — Telehealth: Payer: Self-pay

## 2020-09-28 DIAGNOSIS — Z006 Encounter for examination for normal comparison and control in clinical research program: Secondary | ICD-10-CM

## 2020-09-28 NOTE — Telephone Encounter (Signed)
I called patient for her 90-day Identify Study follow up phone call. Patient is doing well with no cardiac symptoms at this time. Pt did complain of excessive tiredness. Pt has an appointment with her Cardiologist on 10/11/2020. I reminded patient I would call her in March for her 1 year follow-up.

## 2020-10-11 ENCOUNTER — Ambulatory Visit: Payer: Managed Care, Other (non HMO) | Admitting: Cardiology

## 2020-10-11 NOTE — Progress Notes (Deleted)
Cardiology Office Note:    Date:  10/11/2020   ID:  Lorraine Bennett, DOB Jun 03, 1975, MRN [id number]  PCP:  Hurshel Party, NP  Cardiologist:  Thomasene Ripple, DO  Electrophysiologist:  None   Referring MD: Hurshel Party, NP   No chief complaint on file.   History of Present Illness:    Lorraine Bennett is a 45 y.o. female with a hx of hypertension, smoker.  I did see the patient in February 2021 at that time she was status post hospitalization which reported concern for abnormal nuclear stress test.  This information we proceeded with coronary CTA.  She got her coronary CTA which showed normal coronaries with no evidence of coronary artery disease.  Here today for follow-up visit.  Past Medical History:  Diagnosis Date   Anxiety    Asthma    Hypertension     Past Surgical History:  Procedure Laterality Date   CESAREAN SECTION      Current Medications: No outpatient medications have been marked as taking for the 10/11/20 encounter (Appointment) with Thomasene Ripple, DO.     Allergies:   Amoxicillin, Lisinopril-hydrochlorothiazide, and Penicillins   Social History   Socioeconomic History   Marital status: Single    Spouse name: Not on file   Number of children: Not on file   Years of education: Not on file   Highest education level: Not on file  Occupational History   Not on file  Tobacco Use   Smoking status: Every Day   Smokeless tobacco: Not on file  Substance and Sexual Activity   Alcohol use: Yes   Drug use: No   Sexual activity: Not on file  Other Topics Concern   Not on file  Social History Narrative   Not on file   Social Determinants of Health   Financial Resource Strain: Not on file  Food Insecurity: Not on file  Transportation Needs: Not on file  Physical Activity: Not on file  Stress: Not on file  Social Connections: Not on file     Family History: The patient's family history is not on file.  ROS:   Review of Systems   Constitution: Negative for decreased appetite, fever and weight gain.  HENT: Negative for congestion, ear discharge, hoarse voice and sore throat.   Eyes: Negative for discharge, redness, vision loss in right eye and visual halos.  Cardiovascular: Negative for chest pain, dyspnea on exertion, leg swelling, orthopnea and palpitations.  Respiratory: Negative for cough, hemoptysis, shortness of breath and snoring.   Endocrine: Negative for heat intolerance and polyphagia.  Hematologic/Lymphatic: Negative for bleeding problem. Does not bruise/bleed easily.  Skin: Negative for flushing, nail changes, rash and suspicious lesions.  Musculoskeletal: Negative for arthritis, joint pain, muscle cramps, myalgias, neck pain and stiffness.  Gastrointestinal: Negative for abdominal pain, bowel incontinence, diarrhea and excessive appetite.  Genitourinary: Negative for decreased libido, genital sores and incomplete emptying.  Neurological: Negative for brief paralysis, focal weakness, headaches and loss of balance.  Psychiatric/Behavioral: Negative for altered mental status, depression and suicidal ideas.  Allergic/Immunologic: Negative for HIV exposure and persistent infections.    EKGs/Labs/Other Studies Reviewed:    The following studies were reviewed today:   EKG:  The ekg ordered today demonstrates   Recent Labs: No results found for requested labs within last 8760 hours.  Recent Lipid Panel No results found for: CHOL, TRIG, HDL, CHOLHDL, VLDL, LDLCALC, LDLDIRECT  Physical Exam:    VS:  There were no vitals  taken for this visit.    Wt Readings from Last 3 Encounters:  05/02/20 155 lb 3.2 oz (70.4 kg)  09/26/14 129 lb (58.5 kg)     GEN: Well nourished, well developed in no acute distress HEENT: Normal NECK: No JVD; No carotid bruits LYMPHATICS: No lymphadenopathy CARDIAC: S1S2 noted,RRR, no murmurs, rubs, gallops RESPIRATORY:  Clear to auscultation without rales, wheezing or rhonchi   ABDOMEN: Soft, non-tender, non-distended, +bowel sounds, no guarding. EXTREMITIES: No edema, No cyanosis, no clubbing MUSCULOSKELETAL:  No deformity  SKIN: Warm and dry NEUROLOGIC:  Alert and oriented x 3, non-focal PSYCHIATRIC:  Normal affect, good insight  ASSESSMENT:    No diagnosis found. PLAN:     1.  The patient is in agreement with the above plan. The patient left the office in stable condition.  The patient will follow up in   Medication Adjustments/Labs and Tests Ordered: Current medicines are reviewed at length with the patient today.  Concerns regarding medicines are outlined above.  No orders of the defined types were placed in this encounter.  No orders of the defined types were placed in this encounter.   There are no Patient Instructions on file for this visit.   Adopting a Healthy Lifestyle.  Know what a healthy weight is for you (roughly BMI <25) and aim to maintain this   Aim for 7+ servings of fruits and vegetables daily   65-80+ fluid ounces of water or unsweet tea for healthy kidneys   Limit to max 1 drink of alcohol per day; avoid smoking/tobacco   Limit animal fats in diet for cholesterol and heart health - choose grass fed whenever available   Avoid highly processed foods, and foods high in saturated/trans fats   Aim for low stress - take time to unwind and care for your mental health   Aim for 150 min of moderate intensity exercise weekly for heart health, and weights twice weekly for bone health   Aim for 7-9 hours of sleep daily   When it comes to diets, agreement about the perfect plan isnt easy to find, even among the experts. Experts at the [facility] [employer] developed an idea known as the Healthy Eating Plate. Just imagine a plate divided into logical, healthy portions.   The emphasis is on diet quality:   Load up on vegetables and fruits - one-half of your plate: Aim for color and variety, and remember that potatoes  dont count.   Go for whole grains - one-quarter of your plate: Whole wheat, barley, wheat berries, quinoa, oats, brown rice, and foods made with them. If you want pasta, go with whole wheat pasta.   Protein power - one-quarter of your plate: Fish, chicken, beans, and nuts are all healthy, versatile protein sources. Limit red meat.   The diet, however, does go beyond the plate, offering a few other suggestions.   Use healthy plant oils, such as olive, canola, soy, corn, sunflower and peanut. Check the labels, and avoid partially hydrogenated oil, which have unhealthy trans fats.   If youre thirsty, drink water. Coffee and tea are good in moderation, but skip sugary drinks and limit milk and dairy products to one or two daily servings.   The type of carbohydrate in the diet is more important than the amount. Some sources of carbohydrates, such as vegetables, fruits, whole grains, and beans-are healthier than others.   Finally, stay active  Signed, Thomasene Ripple, DO  10/11/2020 11:45 AM  [facility]

## 2020-10-13 ENCOUNTER — Ambulatory Visit: Payer: Managed Care, Other (non HMO) | Admitting: Cardiology

## 2020-11-18 ENCOUNTER — Telehealth: Payer: Self-pay | Admitting: Cardiology

## 2020-11-18 MED ORDER — CARVEDILOL 12.5 MG PO TABS: 12.5000 mg | ORAL_TABLET | Freq: Two times a day (BID) | ORAL | 3 refills | Status: DC

## 2020-11-18 NOTE — Telephone Encounter (Signed)
Refill sent in per request.  

## 2020-11-18 NOTE — Telephone Encounter (Signed)
*  STAT* If patient is at the pharmacy, call can be transferred to refill team.   1. Which medications need to be refilled? (please list name of each medication and dose if known) carvedilol (COREG) 12.5 MG tablet  2. Which pharmacy/location (including street and city if local pharmacy) is medication to be sent to? [facility] [place] [address]  3. Do they need a 30 day or 90 day supply? 30 day   Patient does not have enough to make it to Monday.

## 2020-11-24 ENCOUNTER — Other Ambulatory Visit: Payer: Self-pay

## 2020-11-25 ENCOUNTER — Ambulatory Visit (INDEPENDENT_AMBULATORY_CARE_PROVIDER_SITE_OTHER): Payer: Managed Care, Other (non HMO) | Admitting: Cardiology

## 2020-11-25 ENCOUNTER — Other Ambulatory Visit: Payer: Self-pay

## 2020-11-25 ENCOUNTER — Encounter: Payer: Self-pay | Admitting: Cardiology

## 2020-11-25 VITALS — BP 160/94 | HR 80 | Ht 60.0 in | Wt 160.6 lb

## 2020-11-25 DIAGNOSIS — I10 Essential (primary) hypertension: Secondary | ICD-10-CM | POA: Diagnosis not present

## 2020-11-25 DIAGNOSIS — E669 Obesity, unspecified: Secondary | ICD-10-CM | POA: Diagnosis not present

## 2020-11-25 MED ORDER — CARVEDILOL 25 MG PO TABS: 25.0000 mg | ORAL_TABLET | Freq: Two times a day (BID) | ORAL | 3 refills | Status: DC

## 2020-11-25 NOTE — Progress Notes (Signed)
Cardiology Office Note:    Date:  11/25/2020   ID:  Lorraine Bennett, DOB 1975-10-31, MRN [id number]  PCP:  Hurshel Party, NP  Cardiologist:  Thomasene Ripple, DO  Electrophysiologist:  None   Referring MD: Hurshel Party, NP   " I am in some pain"  History of Present Illness:    Lorraine Bennett is a 45 y.o. female with a hx of hypertension, obesity, smoker here today for follow-up visit.  I saw the patient on May 03, 2019 at that time she was posthospitalization for out of hospital where she had an abnormal nuclear stress test.  I sent her for coronary CTA.  She was able to get a coronary CTA which did not show any evidence of coronary artery disease.  At that time she was also hypertensive I started the patient on beta-blocker.  She has not had any chest pain but has had some back pain.  She tells me that she has been off her pain medication and her PCP has taken the hydrocodone and put her on tramadol.  She does not feel that she is controlled with the pain with this.  She denies any chest pain or shortness of breath.  Past Medical History:  Diagnosis Date   Advanced maternal age in pregnancy 02/21/2012   Anxiety    Asthma    Chronic bilateral low back pain with bilateral sciatica 07/15/2018   Last Assessment & Plan:  Formatting of this note might be different from the original. Will provide patient with gabapentin 600mg  QHS. She has taken this medication in the past with no adverse effects.   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  Pt desires removal Will eval further at annual exam   Hypertension    Menorrhagia 04/29/2020   Formatting of this note might be different from the original.  Heavy and irregular bleeding until 11/21. No menses since then. Also with associated hot flashes Pelvic sono 2/22- unremarkable Normal labs. No evidence of menopause.  Will follow conservatively for now.   Neck pain 07/15/2018   Last  Assessment & Plan:  Formatting of this note might be different from the original. Due to patients worsening cervical pain, we will order an MRI to further evaluate spine pathology.   Will provide patient with celebrex 200mg  daily.   Spent time with patient today discussing possible percutaneous interventions that are available to her. She understands the risks and benefits of these procedures   Other specified complication, antepartum(646.83) 02/21/2012   Vulvar skin tag 04/29/2020   Formatting of this note might be different from the original.  Pt desires removal Removed 05/30/20.  Path- Squamous papillomatous lesion.  No malignancy is seen.  Also with condyloma. Start aldara    Past Surgical History:  Procedure Laterality Date   CESAREAN SECTION      Current Medications: Current Meds  Medication Sig   albuterol (VENTOLIN HFA) 108 (90 Base) MCG/ACT inhaler INHALE TWO PUFFS EVERY 4 TO 6 HOURS AS NEEDED FOR WHEEZING OR SHORTNESS OF BREATH   ALPRAZolam (XANAX) 1 MG tablet Take 1 mg by mouth as needed for anxiety.   carvedilol (COREG) 25 MG tablet Take 1 tablet (25 mg total) by mouth 2 (two) times daily.   cyclobenzaprine (FLEXERIL) 10 MG tablet Take 1 tablet by mouth at bedtime.   dicyclomine (BENTYL) 10 MG capsule Take 10 mg by mouth as needed for pain. Of abdomen  DULoxetine (CYMBALTA) 20 MG capsule Take 20 mg by mouth daily.   fluticasone (FLONASE) 50 MCG/ACT nasal spray Place 1 spray into the nose daily.   Hyoscyamine Sulfate SL 0.125 MG SUBL Place 1 tablet under the tongue every 4 (four) hours as needed for pain. Of the abdomen   imiquimod (ALDARA) 5 % cream Apply 1 application topically 3 (three) times a week.   montelukast (SINGULAIR) 10 MG tablet Take 1 tablet by mouth daily.   omeprazole (PRILOSEC) 40 MG capsule Take 40 mg by mouth daily.   potassium chloride (MICRO-K) 10 MEQ CR capsule Take 10 mEq by mouth daily.   SYMBICORT 160-4.5 MCG/ACT inhaler Inhale 2 puffs into the lungs 2  (two) times daily.   traMADol (ULTRAM) 50 MG tablet Take 2 tablets by mouth 2 (two) times daily.   [DISCONTINUED] carvedilol (COREG) 12.5 MG tablet Take 1 tablet (12.5 mg total) by mouth 2 (two) times daily.     Allergies:   Amoxicillin, Lisinopril-hydrochlorothiazide, and Penicillins   Social History   Socioeconomic History   Marital status: Single    Spouse name: Not on file   Number of children: Not on file   Years of education: Not on file   Highest education level: Not on file  Occupational History   Not on file  Tobacco Use   Smoking status: Every Day   Smokeless tobacco: Never  Substance and Sexual Activity   Alcohol use: Yes   Drug use: No   Sexual activity: Not on file  Other Topics Concern   Not on file  Social History Narrative   Not on file   Social Determinants of Health   Financial Resource Strain: Not on file  Food Insecurity: Not on file  Transportation Needs: Not on file  Physical Activity: Not on file  Stress: Not on file  Social Connections: Not on file     Family History: The patient's family history includes Diabetes in her maternal aunt; Hypertension in her father, maternal grandmother, and mother. There is no history of Heart disease.  ROS:   Review of Systems  Constitution: Negative for decreased appetite, fever and weight gain.  HENT: Negative for congestion, ear discharge, hoarse voice and sore throat.   Eyes: Negative for discharge, redness, vision loss in right eye and visual halos.  Cardiovascular: Negative for chest pain, dyspnea on exertion, leg swelling, orthopnea and palpitations.  Respiratory: Negative for cough, hemoptysis, shortness of breath and snoring.   Endocrine: Negative for heat intolerance and polyphagia.  Hematologic/Lymphatic: Negative for bleeding problem. Does not bruise/bleed easily.  Skin: Negative for flushing, nail changes, rash and suspicious lesions.  Musculoskeletal: Negative for arthritis, joint pain, muscle  cramps, myalgias, neck pain and stiffness.  Gastrointestinal: Negative for abdominal pain, bowel incontinence, diarrhea and excessive appetite.  Genitourinary: Negative for decreased libido, genital sores and incomplete emptying.  Neurological: Negative for brief paralysis, focal weakness, headaches and loss of balance.  Psychiatric/Behavioral: Negative for altered mental status, depression and suicidal ideas.  Allergic/Immunologic: Negative for HIV exposure and persistent infections.    EKGs/Labs/Other Studies Reviewed:    The following studies were reviewed today:   EKG:  The ekg ordered today demonstrates   Coronary CTA March 2022 Aorta: Normal size.  No calcifications.  No dissection.   Aortic Valve:  Trileaflet.  No calcifications.   Coronary Arteries:  Normal coronary origin.  Right dominance.   RCA is a large dominant artery that gives rise to PDA and PLVB. There is no  plaque.   Left main is a large artery that gives rise to LAD and LCX arteries.   LAD is a large vessel that has no plaque.   LCX is a non-dominant artery that gives rise to one large OM1 branch. There is no plaque.   Other findings:   Normal pulmonary vein drainage into the left atrium. 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   Normal left atrial appendage without a thrombus.   Normal size of the pulmonary artery.   IMPRESSION: 1. Coronary calcium score of 0. This was 0 percentile for age and sex matched control.   2. Normal coronary origin with right dominance.   3. No evidence of CAD. CAD-RADS 0.   Recommendations: Consider non-atherosclerotic causes of chest pain.   Rachana Malesky, DO    Recent Labs: No results found for requested labs within last 8760 hours.  Recent Lipid Panel No results found for: CHOL, TRIG, HDL, CHOLHDL, VLDL, LDLCALC, LDLDIRECT  Physical Exam:    VS:  BP (!) 160/94   Pulse 80   Ht 5' (1.524 m)   Wt 160 lb 9.6 oz (72.8 kg)   SpO2 97%   BMI 31.37 kg/m      Wt Readings from Last 3 Encounters:  11/25/20 160 lb 9.6 oz (72.8 kg)  05/02/20 155 lb 3.2 oz (70.4 kg)  09/26/14 129 lb (58.5 kg)     GEN: Well nourished, well developed in no acute distress HEENT: Normal NECK: No JVD; No carotid bruits LYMPHATICS: No lymphadenopathy CARDIAC: S1S2 noted,RRR, no murmurs, rubs, gallops RESPIRATORY:  Clear to auscultation without rales, wheezing or rhonchi  ABDOMEN: Soft, non-tender, non-distended, +bowel sounds, no guarding. EXTREMITIES: No edema, No cyanosis, no clubbing MUSCULOSKELETAL:  No deformity  SKIN: Warm and dry NEUROLOGIC:  Alert and oriented x 3, non-focal PSYCHIATRIC:  Normal affect, good insight  ASSESSMENT:    1. Primary hypertension   2. Obesity (BMI 30-39.9)    PLAN:    She is hypertensive in the office today.  Uncontrolled pain may be contributing.  But the patient tells me that at her PCP office is also has been high recently.  I like to increase her carvedilol to 25 mg twice daily.  She is monitoring her blood pressure daily and send me that information via MyChart.  If her blood pressure is not less than 130/80 we will consider adding additional antihypertensive medication.  The patient understands the need to lose weight with diet and exercise. We have discussed specific strategies for this.  The patient is in agreement with the above plan. The patient left the office in stable condition.  The patient will follow up in   Medication Adjustments/Labs and Tests Ordered: Current medicines are reviewed at length with the patient today.  Concerns regarding medicines are outlined above.  No orders of the defined types were placed in this encounter.  Meds ordered this encounter  Medications   carvedilol (COREG) 25 MG tablet    Sig: Take 1 tablet (25 mg total) by mouth 2 (two) times daily.    Dispense:  180 tablet    Refill:  3    Patient Instructions  Medication Instructions:  Your physician has recommended you make the  following change in your medication:  INCREASE: Coreg 25 mg twice daily Please take your blood pressure daily for a week and send a MyChart message.  *If you need a refill on your cardiac medications before your next appointment, please call your pharmacy*  Lab Work: None If you have labs (blood work) drawn today and your tests are completely normal, you will receive your results only by: MyChart Message (if you have MyChart) OR A paper copy in the mail If you have any lab test that is abnormal or we need to change your treatment, we will call you to review the results.   Testing/Procedures: None   Follow-Up: At [facility], you and your health needs are our priority.  As part of our continuing mission to provide you with exceptional heart care, we have created designated Provider Care Teams.  These Care Teams include your primary Cardiologist (physician) and Advanced Practice Providers (APPs -  Physician Assistants and Nurse Practitioners) who all work together to provide you with the care you need, when you need it.  We recommend signing up for the patient portal called "MyChart".  Sign up information is provided on this After Visit Summary.  MyChart is used to connect with patients for Virtual Visits (Telemedicine).  Patients are able to view lab/test results, encounter notes, upcoming appointments, etc.  Non-urgent messages can be sent to your provider as well.   To learn more about what you can do with MyChart, go to [email or web address].    Your next appointment:   6 month(s)  The format for your next appointment:   In Person  Provider:   Thomasene Ripple, DO [address], [place], [place] [address]    Other Instructions     Adopting a Healthy Lifestyle.  Know what a healthy weight is for you (roughly BMI <25) and aim to maintain this   Aim for 7+ servings of fruits and vegetables daily   65-80+ fluid ounces of water or unsweet tea for healthy kidneys    Limit to max 1 drink of alcohol per day; avoid smoking/tobacco   Limit animal fats in diet for cholesterol and heart health - choose grass fed whenever available   Avoid highly processed foods, and foods high in saturated/trans fats   Aim for low stress - take time to unwind and care for your mental health   Aim for 150 min of moderate intensity exercise weekly for heart health, and weights twice weekly for bone health   Aim for 7-9 hours of sleep daily   When it comes to diets, agreement about the perfect plan isnt easy to find, even among the experts. Experts at the [facility] [employer] developed an idea known as the Healthy Eating Plate. Just imagine a plate divided into logical, healthy portions.   The emphasis is on diet quality:   Load up on vegetables and fruits - one-half of your plate: Aim for color and variety, and remember that potatoes dont count.   Go for whole grains - one-quarter of your plate: Whole wheat, barley, wheat berries, quinoa, oats, brown rice, and foods made with them. If you want pasta, go with whole wheat pasta.   Protein power - one-quarter of your plate: Fish, chicken, beans, and nuts are all healthy, versatile protein sources. Limit red meat.   The diet, however, does go beyond the plate, offering a few other suggestions.   Use healthy plant oils, such as olive, canola, soy, corn, sunflower and peanut. Check the labels, and avoid partially hydrogenated oil, which have unhealthy trans fats.   If youre thirsty, drink water. Coffee and tea are good in moderation, but skip sugary drinks and limit milk and dairy products to one or two daily servings.  The type of carbohydrate in the diet is more important than the amount. Some sources of carbohydrates, such as vegetables, fruits, whole grains, and beans-are healthier than others.   Finally, stay active  Signed, Thomasene Ripple, DO  11/25/2020 1:11 PM    [facility]

## 2020-11-25 NOTE — Patient Instructions (Addendum)
Medication Instructions:  Your physician has recommended you make the following change in your medication:  INCREASE: Coreg 25 mg twice daily Please take your blood pressure daily for a week and send a MyChart message.  *If you need a refill on your cardiac medications before your next appointment, please call your pharmacy*   Lab Work: None If you have labs (blood work) drawn today and your tests are completely normal, you will receive your results only by: MyChart Message (if you have MyChart) OR A paper copy in the mail If you have any lab test that is abnormal or we need to change your treatment, we will call you to review the results.   Testing/Procedures: None   Follow-Up: At [facility], you and your health needs are our priority.  As part of our continuing mission to provide you with exceptional heart care, we have created designated Provider Care Teams.  These Care Teams include your primary Cardiologist (physician) and Advanced Practice Providers (APPs -  Physician Assistants and Nurse Practitioners) who all work together to provide you with the care you need, when you need it.  We recommend signing up for the patient portal called "MyChart".  Sign up information is provided on this After Visit Summary.  MyChart is used to connect with patients for Virtual Visits (Telemedicine).  Patients are able to view lab/test results, encounter notes, upcoming appointments, etc.  Non-urgent messages can be sent to your provider as well.   To learn more about what you can do with MyChart, go to [email or web address].    Your next appointment:   6 month(s)  The format for your next appointment:   In Person  Provider:   Thomasene Ripple, DO [address], [place], [place] [address]    Other Instructions

## 2021-05-24 ENCOUNTER — Ambulatory Visit: Payer: Managed Care, Other (non HMO) | Admitting: Cardiology

## 2021-05-29 ENCOUNTER — Encounter: Payer: Self-pay | Admitting: Cardiology

## 2021-12-29 ENCOUNTER — Other Ambulatory Visit: Payer: Self-pay | Admitting: Cardiology

## 2022-02-05 ENCOUNTER — Telehealth: Payer: Self-pay | Admitting: Cardiology

## 2022-02-05 ENCOUNTER — Other Ambulatory Visit: Payer: Self-pay | Admitting: Cardiology

## 2022-02-05 MED ORDER — CARVEDILOL 25 MG PO TABS: 25.0000 mg | ORAL_TABLET | Freq: Two times a day (BID) | ORAL | 5 refills | Status: AC

## 2022-02-05 NOTE — Telephone Encounter (Signed)
*  STAT* If patient is at the pharmacy, call can be transferred to refill team.   1. Which medications need to be refilled? (please list name of each medication and dose if known)   carvedilol (COREG) 25 MG tablet    2. Which pharmacy/location (including street and city if local pharmacy) is medication to be sent to? [facility] [place] [address]   3. Do they need a 30 day or 90 day supply? 90   Patient has appt 1/10

## 2022-03-14 ENCOUNTER — Ambulatory Visit: Payer: Managed Care, Other (non HMO) | Admitting: Cardiology

## 2022-03-14 ENCOUNTER — Ambulatory Visit: Payer: Managed Care, Other (non HMO) | Attending: Cardiology | Admitting: Cardiology

## 2022-08-12 IMAGING — CT CT HEART MORP W/ CTA COR W/ SCORE W/ CA W/CM &/OR W/O CM
4 of 7 series · 8 of 20 positions shown, 9 images · IV contrast (APPLIED)
Comparison: None.
COMPARISON: None.

Addendum:
EXAM:
OVER-READ INTERPRETATION  CT CHEST

The following report is an over-read performed by radiologist Dr.
Giih Daudt [REDACTED] on 05/26/2020. This
over-read does not include interpretation of cardiac or coronary
anatomy or pathology. The coronary calcium score/coronary CTA
interpretation by the cardiologist is attached.
CLINICAL DATA: This is a 44 year old female with chest pain.
Cardiac/Coronary  CT
TECHNIQUE: The patient was scanned on a [employer] scanner.

[Series 6: best diast 70 % · axial · 0.34mm/px · z∈[-86,-51]mm · 2 of 258 slices shown, 3 images]
[im 86/258  vessel]
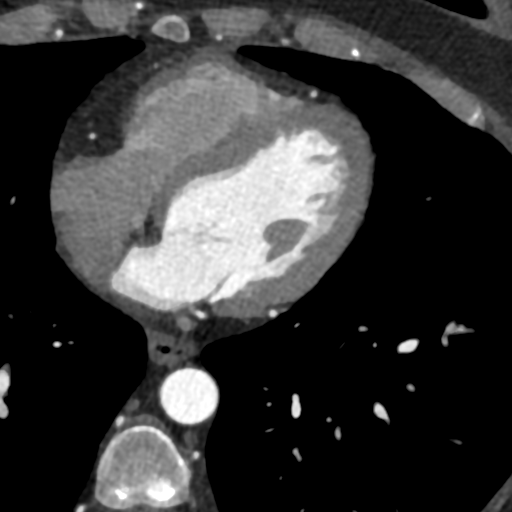
[im 86/258  lung]
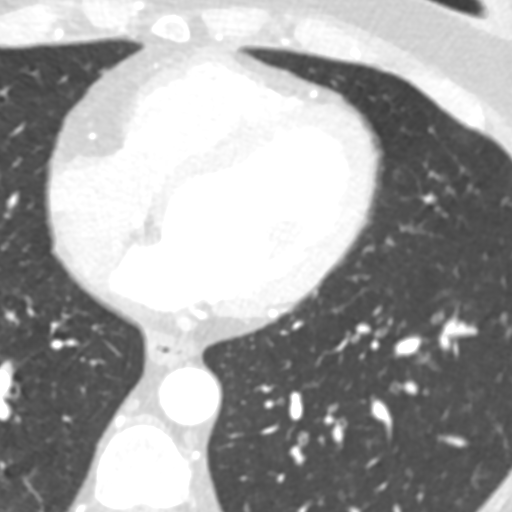
[im 172/258  vessel]
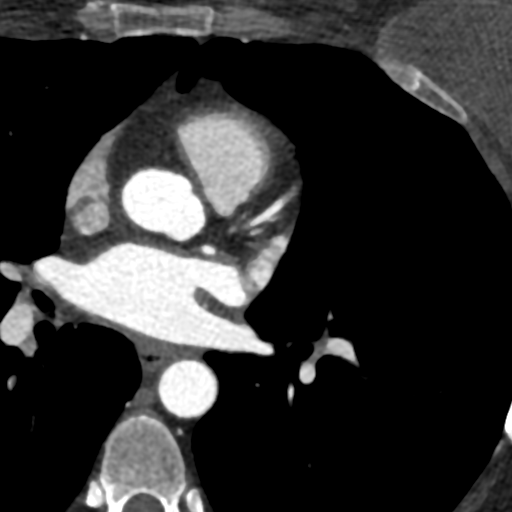

[Series 7: best syst 38 % · axial · 0.34mm/px · z∈[-86,-51]mm · 2 of 258 slices shown]
[im 86/258  vessel]
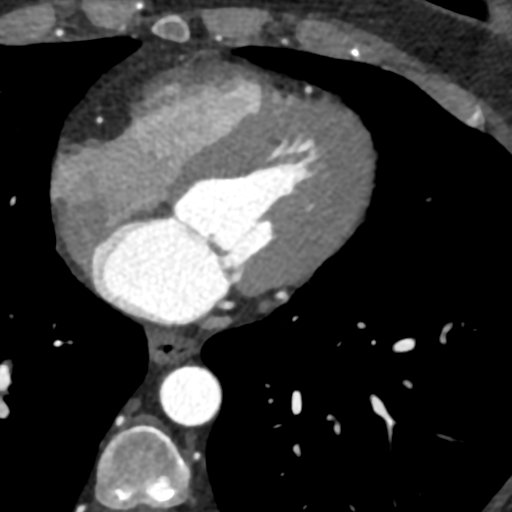
[im 172/258  vessel]
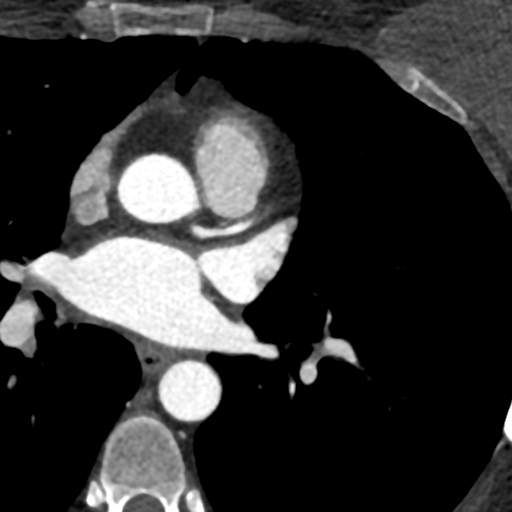

[Series 8: ts diast sharp 70 % · axial · 0.34mm/px · z∈[-86,-51]mm · 2 of 258 slices shown]
[im 86/258  lung]
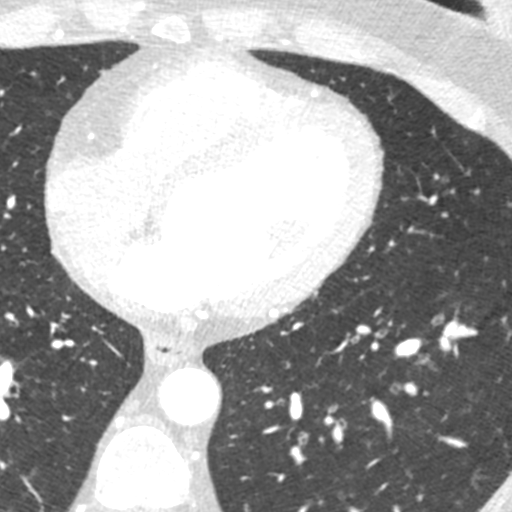
[im 172/258  lung]
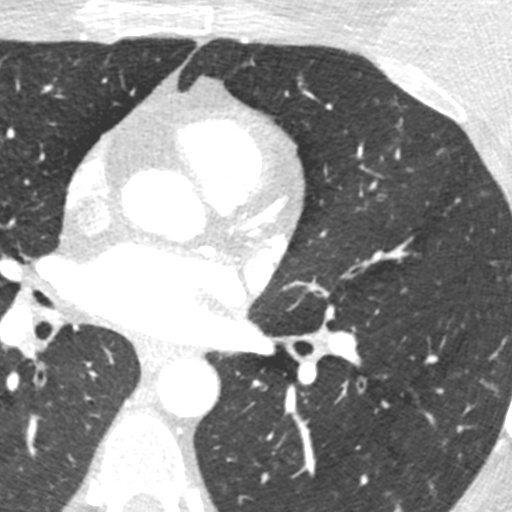

[Series 9: ts syst sharp 38 % · axial · 0.34mm/px · z∈[-86,-51]mm · 2 of 258 slices shown]
[im 86/258  lung]
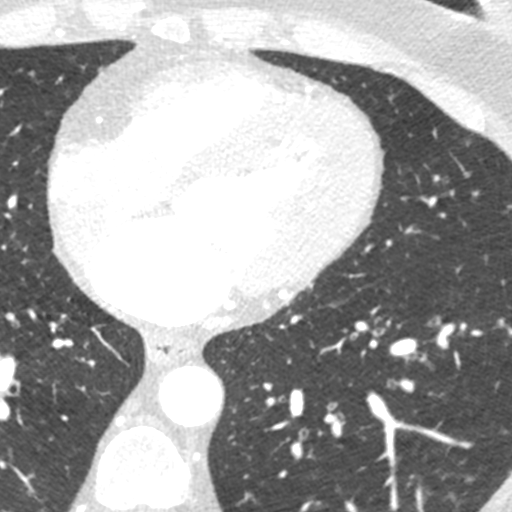
[im 172/258  lung]
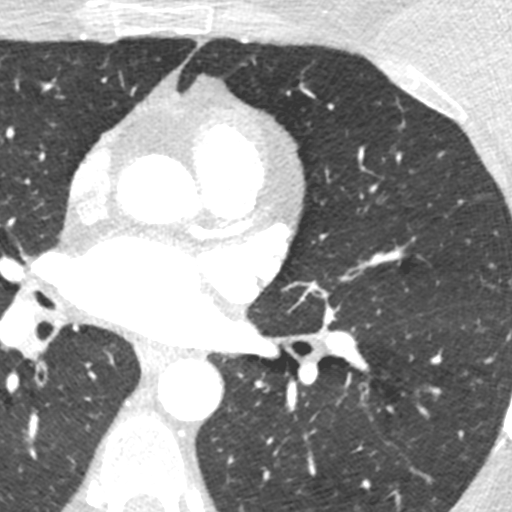

[8 of 20 positions shown; findings below may reference images not displayed]

FINDINGS: Within the visualized portions of the thorax there are no suspicious
appearing pulmonary nodules or masses, there is no acute
consolidative airspace disease, no pleural effusions, no
pneumothorax and no lymphadenopathy. Visualized portions of the
upper abdomen are unremarkable. There are no aggressive appearing
lytic or blastic lesions noted in the visualized portions of the
skeleton. Bilateral breast implants are incidentally noted.
IMPRESSION: 1. No significant incidental noncardiac findings are noted.
FINDINGS: A 120 kV prospective scan was triggered in the descending thoracic
aorta at 111 HU's. Axial non-contrast 3 mm slices were carried out
through the heart. The data set was analyzed on a dedicated work
station and scored using the Agatson method. Gantry rotation speed
was 250 msecs and collimation was .6 mm. No beta blockade and 0.8 mg
of sl NTG was given. The 3D data set was reconstructed in 5%
intervals of the 67-82 % of the R-R cycle. Diastolic phases were
analyzed on a dedicated work station using MPR, MIP and VRT modes.
The patient received 80 cc of contrast.

Aorta: Normal size.  No calcifications.  No dissection.

Aortic Valve:  Trileaflet.  No calcifications.

Coronary Arteries:  Normal coronary origin.  Right dominance.

RCA is a large dominant artery that gives rise to PDA and PLVB.
There is no plaque.

Left main is a large artery that gives rise to LAD and LCX arteries.

LAD is a large vessel that has no plaque.

LCX is a non-dominant artery that gives rise to one large OM1
branch. There is no plaque.

Other findings:

Normal pulmonary vein drainage into the left atrium. There is an
anomalous right pulmonary vein.

Normal left atrial appendage without a thrombus.

Normal size of the pulmonary artery.
IMPRESSION: 1. Coronary calcium score of 0. This was 0 percentile for age and
sex matched control.

2. Normal coronary origin with right dominance.

3. No evidence of CAD. CAD-RADS 0.

Recommendations: Consider non-atherosclerotic causes of chest pain.

Charleston Green, DO

*** End of Addendum ***
EXAM:
OVER-READ INTERPRETATION  CT CHEST

The following report is an over-read performed by radiologist Dr.
Giih Daudt [REDACTED] on 05/26/2020. This
over-read does not include interpretation of cardiac or coronary
anatomy or pathology. The coronary calcium score/coronary CTA
interpretation by the cardiologist is attached.
FINDINGS: Within the visualized portions of the thorax there are no suspicious
appearing pulmonary nodules or masses, there is no acute
consolidative airspace disease, no pleural effusions, no
pneumothorax and no lymphadenopathy. Visualized portions of the
upper abdomen are unremarkable. There are no aggressive appearing
lytic or blastic lesions noted in the visualized portions of the
skeleton. Bilateral breast implants are incidentally noted.
IMPRESSION: 1. No significant incidental noncardiac findings are noted.
